# Patient Record
Sex: Male | Born: 1965 | Race: White | Hispanic: No | Marital: Married | Smoking: Never smoker
Health system: Southern US, Community
[De-identification: ages and names within clinical notes are randomized; demographics above are authoritative.]

## PROBLEM LIST (undated history)

## (undated) DIAGNOSIS — K219 Gastro-esophageal reflux disease without esophagitis: Secondary | ICD-10-CM

## (undated) DIAGNOSIS — I10 Essential (primary) hypertension: Secondary | ICD-10-CM

## (undated) HISTORY — PX: COLONOSCOPY: SHX174

## (undated) HISTORY — DX: Gastro-esophageal reflux disease without esophagitis: K21.9

## (undated) HISTORY — PX: PILONIDAL CYST EXCISION: SHX744

---

## 1994-12-25 HISTORY — PX: ANTERIOR CRUCIATE LIGAMENT REPAIR: SHX115

## 2009-07-06 ENCOUNTER — Encounter (INDEPENDENT_AMBULATORY_CARE_PROVIDER_SITE_OTHER): Payer: Self-pay | Admitting: *Deleted

## 2011-10-23 ENCOUNTER — Other Ambulatory Visit: Payer: Self-pay | Admitting: Family Medicine

## 2011-10-23 DIAGNOSIS — R1032 Left lower quadrant pain: Secondary | ICD-10-CM

## 2011-10-25 ENCOUNTER — Other Ambulatory Visit: Payer: Self-pay

## 2011-11-07 ENCOUNTER — Ambulatory Visit (INDEPENDENT_AMBULATORY_CARE_PROVIDER_SITE_OTHER): Payer: Managed Care, Other (non HMO) | Admitting: General Surgery

## 2011-11-07 ENCOUNTER — Encounter (INDEPENDENT_AMBULATORY_CARE_PROVIDER_SITE_OTHER): Payer: Self-pay | Admitting: General Surgery

## 2011-11-07 VITALS — BP 136/98 | HR 84 | Temp 97.3°F | Resp 16 | Ht 74.0 in | Wt 241.0 lb

## 2011-11-07 DIAGNOSIS — L0501 Pilonidal cyst with abscess: Secondary | ICD-10-CM | POA: Insufficient documentation

## 2011-11-07 MED ORDER — DOXYCYCLINE HYCLATE 50 MG PO CAPS: 100.0000 mg | ORAL_CAPSULE | Freq: Two times a day (BID) | ORAL | Status: AC

## 2011-11-07 MED ORDER — OXYCODONE-ACETAMINOPHEN 5-325 MG PO TABS: 1.0000 | ORAL_TABLET | ORAL | Status: AC | PRN

## 2011-11-07 NOTE — Patient Instructions (Signed)
  Pilonidal Cyst Care After A pilonidal cyst occurs when hairs get trapped (ingrown) beneath the skin in the crease between the buttocks over your sacrum (the bone under that crease). Pilonidal cysts are most common in young men with a lot of body hair. When the cyst breaks(ruptured) or leaks, fluid from the cyst may cause burning and itching. If the cyst becomes infected, it causes a painful swelling filled with pus (abscess). The pus and trapped hairs need to be removed (often by lancing) so that the infection can heal. The word pilonidal means hair nest. HOME CARE INSTRUCTIONS If the pilonidal sinus was NOT DRAINING OR LANCED:  Keep the area clean and dry. Bathe or shower daily. Wash the area well with a germ-killing soap. Hot tub baths may help prevent infection. Dry the area well with a towel.   Avoid tight clothing in order to keep area as moisture-free as possible.   Keep area between buttocks as free from hair as possible. A depilatory may be used.   Take antibiotics as directed.   Only take over-the-counter or prescription medicines for pain, discomfort, or fever as directed by your caregiver.  If the cyst WAS INFECTED AND NEEDED TO BE DRAINED:  Your caregiver may have packed the wound with gauze to keep the wound open. This allows the wound to heal from the inside outward and continue to drain.   Return as directed for a wound check.   In the morning, in the shower, remove the bandage and remove the packing strip. You may cover the area with a dry gauze or wear a pad in your underwear. Sponge baths are a good alternative. Sitz baths may be used three to four times a day or as directed.   If an antibiotic was ordered to fight the infection, take as directed.   Only take over-the-counter or prescription medicines for pain, discomfort, or fever as directed by your caregiver.   If a drain was in place and removed, use sitz baths for 20 minutes 4 times per day. Clean the wound gently  with mild unscented soap, pat dry, and then apply a dry dressing as directed.  If you had surgery and IT WAS MARSUPIALIZED (LEFT OPEN):  Your wound was packed with gauze to keep the wound open. This allows the wound to heal from the inside outwards and continue draining. The changing of the dressing regularly also helps keep the wound clean.   Return as directed for a wound check.   If you take tub baths or showers, repack the wound with gauze as directed following. Sponge baths are a good alternative. Sitz baths can also be used. This may be done three to four times a day or as directed.   If an antibiotic was ordered to fight the infection, take as directed.   Only take over-the-counter or prescription medicines for pain, discomfort, or fever as directed by your caregiver.   If you had surgery and the wound was closed you may care for it as directed. This generally includes keeping it dry and clean and dressing it as directed.  SEEK MEDICAL CARE IF:   You have increased pain, swelling, redness, drainage, or bleeding from the area.   You have a fever.   You have muscles aches, dizziness, or a general ill feeling.  Document Released: 01/11/2007 Document Revised: 08/23/2011 Document Reviewed: 03/28/2007 [facility] Patient Information 2012 [place], [place].

## 2011-11-07 NOTE — Progress Notes (Signed)
Chief complaint: Referral from dermatologist; pilonidal cyst  History of present illness: 45 year old Caucasian male referred by his dermatologist for evaluation of an infected pilonidal cyst.  The patient states that he's had a bump in that area for over a year. He finally went to see a physician about it today.  At the doctor's office, the area was red and swollen and indurated. The papule was shaved off with some drainage of brown purulent material. He was sent here for evaluation. 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 He denies any fevers or chills, weight change, night sweats, or previous incision and drainages in that area.  Past Medical History  Diagnosis Date  . GERD (gastroesophageal reflux disease)   . Pilonidal cyst    Past Surgical History  Procedure Date  . Anterior cruciate ligament repair 1996    left   No Known Allergies  Current Outpatient Prescriptions  Medication Sig Dispense Refill  . fish oil-omega-3 fatty acids 1000 MG capsule Take 2 g by mouth daily.        Marland Kitchen omeprazole (PRILOSEC) 20 MG capsule Take 20 mg by mouth daily.        Marland Kitchen doxycycline (VIBRAMYCIN) 50 MG capsule Take 2 capsules (100 mg total) by mouth 2 (two) times daily.  28 capsule  0  . oxyCODONE-acetaminophen (ROXICET) 5-325 MG per tablet Take 1-2 tablets by mouth every 4 (four) hours as needed for pain.  30 tablet  0   Family History  Problem Relation Age of Onset  . Cancer Paternal Uncle     colon  . Cancer Paternal Grandmother     colon  . Cancer Paternal Grandfather     colon   History  Substance Use Topics  . Smoking status: Never Smoker   . Smokeless tobacco: Not on file  . Alcohol Use: No   Review of systems: A 10 point review of systems was performed. All systems are negative except was met in the history of present illness  Physical exam: BP 136/98  Pulse 84  Temp(Src) 97.3 F (36.3 C) (Temporal)  Resp 16  Ht 6\' 2"  (1.88 m)  Wt 241 lb (109.317 kg)  BMI  30.94 kg/m2 Gen.-well-developed, well-nourished obese Caucasian male in no apparent distress Pulmonary-lungs are clear, symmetric chest rises Cardiac-regular rate and rhythm Abdomen-soft, nontender, nondistended Psych-alert, oriented, appropriate HEENT-atraumatic, normocephalic, pupils equal, no icterus, neck supple Rectal-no external hemorrhoids or fissures Skin-in the gluteal cleft, there is a pilonidal sinus with surrounding hair. 1 cm superior and to the left of the gluteal fold there is an area of induration and tenderness about 2 cm x 2 cm. The overlying skin as been shaved.  Data reviewed: Reviewed the handwritten note from the patient's dermatologist  Assessment and plan: Pilonidal sinus with abscess  I do not believe this has been adequately drained. I recommended incising the area to promote further drainage. We discussed the risks and benefits including but not limited to bleeding, worsening infection, need for additional procedures. We discussed the aftercare.  After obtaining verbal consent, the area was prepped with Betadine. 2 cc of 1% Xylocaine mixed with epinephrine and sodium bicarbonate was injected over the area of maximal induration. I then made a cruciate incision with #15 blade. The cavity was opened up. The patient tolerated the procedure well. The wound was packed with quarter inch iodoform gauze followed by a dressing.  We had discussed pilonidal disease. The patient was given Agricultural engineer. The patient was  given wound care instructions. He is given a prescription for doxycycline and Percocet. I'll see him in the office in 2 weeks.

## 2011-11-23 ENCOUNTER — Encounter (INDEPENDENT_AMBULATORY_CARE_PROVIDER_SITE_OTHER): Payer: Managed Care, Other (non HMO) | Admitting: General Surgery

## 2011-11-24 ENCOUNTER — Encounter (INDEPENDENT_AMBULATORY_CARE_PROVIDER_SITE_OTHER): Payer: Managed Care, Other (non HMO) | Admitting: General Surgery

## 2016-06-06 ENCOUNTER — Encounter: Payer: Self-pay | Admitting: Gastroenterology

## 2016-07-18 ENCOUNTER — Ambulatory Visit (AMBULATORY_SURGERY_CENTER): Payer: Self-pay

## 2016-07-18 ENCOUNTER — Encounter (INDEPENDENT_AMBULATORY_CARE_PROVIDER_SITE_OTHER): Payer: Self-pay

## 2016-07-18 VITALS — Ht 75.0 in | Wt 240.4 lb

## 2016-07-18 DIAGNOSIS — Z800 Family history of malignant neoplasm of digestive organs: Secondary | ICD-10-CM

## 2016-07-18 MED ORDER — NA SULFATE-K SULFATE-MG SULF 17.5-3.13-1.6 GM/177ML PO SOLN: ORAL | 0 refills | Status: DC

## 2016-07-18 NOTE — Progress Notes (Signed)
Patient ID: Ryan Maddox, male   DOB: 1966-09-08, 50 y.o.   MRN: [id number] Per pt, no allergies to soy or egg products.Pt not taking any weight loss meds or using  O2 at home.

## 2016-07-19 ENCOUNTER — Encounter: Payer: Self-pay | Admitting: Gastroenterology

## 2016-08-04 ENCOUNTER — Ambulatory Visit (AMBULATORY_SURGERY_CENTER): Payer: Managed Care, Other (non HMO) | Admitting: Gastroenterology

## 2016-08-04 ENCOUNTER — Encounter: Payer: Self-pay | Admitting: Gastroenterology

## 2016-08-04 VITALS — BP 131/96 | HR 72 | Temp 98.0°F | Resp 16 | Ht 75.0 in | Wt 240.0 lb

## 2016-08-04 DIAGNOSIS — Z1211 Encounter for screening for malignant neoplasm of colon: Secondary | ICD-10-CM

## 2016-08-04 DIAGNOSIS — Z800 Family history of malignant neoplasm of digestive organs: Secondary | ICD-10-CM

## 2016-08-04 MED ORDER — SODIUM CHLORIDE 0.9 % IV SOLN: 500.0000 mL | INTRAVENOUS | Status: AC

## 2016-08-04 NOTE — Progress Notes (Signed)
Report to PACU, RN, vss, BBS= Clear.  

## 2016-08-04 NOTE — Op Note (Signed)
[facility] Patient Name: Ryan Maddox Procedure Date: 08/04/2016 10:49 AM MRN: [id number] Endoscopist: Sherilyn Cooter L. Myrtie Neither , MD Age: 50 Referring MD:  Date of Birth: Mar 07, 1966 Gender: Male Account #: [id number] Procedure:                Colonoscopy Indications:              Screening for colorectal malignant neoplasm, Family                            history of colon cancer in multiple second-degree                            relatives (patient's last colonoscopy in 2005) Medicines:                Monitored Anesthesia Care Procedure:                Pre-Anesthesia Assessment:                           - Prior to the procedure, a History and Physical                            was performed, and patient medications and                            allergies were reviewed. The patient's tolerance of                            previous anesthesia was also reviewed. The risks                            and benefits of the procedure and the sedation                            options and risks were discussed with the patient.                            All questions were answered, and informed consent                            was obtained. Prior Anticoagulants: The patient has                            taken no previous anticoagulant or antiplatelet                            agents. ASA Grade Assessment: II - A patient with                            mild systemic disease. After reviewing the risks                            and benefits, the patient was deemed in  satisfactory condition to undergo the procedure.                           After obtaining informed consent, the colonoscope                            was passed under direct vision. Throughout the                            procedure, the patient's blood pressure, pulse, and                            oxygen saturations were monitored continuously. The                            Model  CF-[id number]) scope was introduced                            through the anus and advanced to the the cecum,                            identified by appendiceal orifice and ileocecal                            valve. The colonoscopy was performed without                            difficulty. The patient tolerated the procedure                            well. The quality of the bowel preparation was                            excellent. The ileocecal valve, appendiceal                            orifice, and rectum were photographed. The quality                            of the bowel preparation was evaluated using the                            BBPS [facility] Bowel Preparation Scale) with scores                            of: Right Colon = 3, Transverse Colon = 3 and Left                            Colon = 3 (entire mucosa seen well with no residual                            staining, small fragments of stool or opaque  liquid). The total BBPS score equals 9. The bowel                            preparation used was SUPREP. Scope In: 11:02:56 AM Scope Out: 11:13:16 AM Scope Withdrawal Time: 0 hours 7 minutes 9 seconds  Total Procedure Duration: 0 hours 10 minutes 20 seconds  Findings:                 The perianal and digital rectal examinations were                            normal.                           Multiple diverticula were found in the left colon.                           The exam was otherwise without abnormality on                            direct and retroflexion views. Complications:            No immediate complications. Estimated Blood Loss:     Estimated blood loss: none. Impression:               - Diverticulosis in the left colon.                           - The examination was otherwise normal on direct                            and retroflexion views.                           - No specimens collected. Recommendation:            - Patient has a contact number available for                            emergencies. The signs and symptoms of potential                            delayed complications were discussed with the                            patient. Return to normal activities tomorrow.                            Written discharge instructions were provided to the                            patient.                           - Resume previous diet.                           - Continue present  medications.                           - Repeat colonoscopy in 10 years for screening                            purposes. Henry L. Myrtie Neitheranis, MD 08/04/2016 11:20:28 AM This report has been signed electronically.

## 2016-08-04 NOTE — Patient Instructions (Signed)
Diverticulosis seen today. Handout given on diverticulosis. Resume current medications. Repeat colonoscopy in 10 years. Call us with any questions or concerns. Thank you!!   YOU HAD AN ENDOSCOPIC PROCEDURE TODAY AT THE [facility]:   Refer to the procedure report that was given to you for any specific questions about what was found during the examination.  If the procedure report does not answer your questions, please call your gastroenterologist to clarify.  If you requested that your care partner not be given the details of your procedure findings, then the procedure report has been included in a sealed envelope for you to review at your convenience later.  YOU SHOULD EXPECT: Some feelings of bloating in the abdomen. Passage of more gas than usual.  Walking can help get rid of the air that was put into your GI tract during the procedure and reduce the bloating. If you had a lower endoscopy (such as a colonoscopy or flexible sigmoidoscopy) you may notice spotting of blood in your stool or on the toilet paper. If you underwent a bowel prep for your procedure, you may not have a normal bowel movement for a few days.  Please Note:  You might notice some irritation and congestion in your nose or some drainage.  This is from the oxygen used during your procedure.  There is no need for concern and it should clear up in a day or so.  SYMPTOMS TO REPORT IMMEDIATELY:   Following lower endoscopy (colonoscopy or flexible sigmoidoscopy):  Excessive amounts of blood in the stool  Significant tenderness or worsening of abdominal pains  Swelling of the abdomen that is new, acute  Fever of 100F or higher   For urgent or emergent issues, a gastroenterologist can be reached at any hour by calling [phone].   DIET: Your first meal following the procedure should be a small meal and then it is ok to progress to your normal diet. Heavy or fried foods are harder to digest and may make you feel  nauseous or bloated.  Likewise, meals heavy in dairy and vegetables can increase bloating.  Drink plenty of fluids but you should avoid alcoholic beverages for 24 hours.  ACTIVITY:  You should plan to take it easy for the rest of today and you should NOT DRIVE or use heavy machinery until tomorrow (because of the sedation medicines used during the test).    FOLLOW UP: Our staff will call the number listed on your records the next business day following your procedure to check on you and address any questions or concerns that you may have regarding the information given to you following your procedure. If we do not reach you, we will leave a message.  However, if you are feeling well and you are not experiencing any problems, there is no need to return our call.  We will assume that you have returned to your regular daily activities without incident.  If any biopsies were taken you will be contacted by phone or by letter within the next 1-3 weeks.  Please call us at [phone] if you have not heard about the biopsies in 3 weeks.    SIGNATURES/CONFIDENTIALITY: You and/or your care partner have signed paperwork which will be entered into your electronic medical record.  These signatures attest to the fact that that the information above on your After Visit Summary has been reviewed and is understood.  Full responsibility of the confidentiality of this discharge information lies with you and/or your  care-partner.

## 2016-08-07 ENCOUNTER — Telehealth: Payer: Self-pay | Admitting: *Deleted

## 2016-08-07 NOTE — Telephone Encounter (Signed)
  Follow up Call-  Call back number 08/04/2016  Post procedure Call Back phone  # [phone]  Permission to leave phone message Yes  Some recent data might be hidden     No answer, left message.

## 2017-07-25 ENCOUNTER — Encounter: Payer: Self-pay | Admitting: Neurology

## 2017-07-25 ENCOUNTER — Ambulatory Visit (INDEPENDENT_AMBULATORY_CARE_PROVIDER_SITE_OTHER): Payer: Managed Care, Other (non HMO) | Admitting: Neurology

## 2017-07-25 VITALS — BP 141/99 | HR 91 | Ht 75.0 in | Wt 246.5 lb

## 2017-07-25 DIAGNOSIS — R55 Syncope and collapse: Secondary | ICD-10-CM

## 2017-07-25 NOTE — Patient Instructions (Signed)
   We will check MRI of the brain and get a carotid doppler study and a 2D echo on the heart.

## 2017-07-25 NOTE — Progress Notes (Signed)
Reason for visit: Near syncope  Referring physician: Dr. Ginette PitmanEhinger  Ryan Maddox is a 51 y.o. male  History of present illness:  Ryan Maddox is a 51 year old right-handed white male with history of onset of episodes of dizziness that began about 4 weeks ago. The episodes have become more significant over the last 10 days. The patient is having events where he will have a lightheaded sensation and a sensation of near-syncope that usually occurs when he goes from a sitting to a standing position. The episodes generally do not occur while sitting or while lying down. The patient indicates that the episodes may last about 30 seconds, they are unassociated with loss of vision, but the patient does have a pressure sensation around the left eye during the events and some blurring of vision. He has some cognitive clouding with the episodes. He denies any focal numbness or weakness of the face, arms, or legs. He denies any slurred speech, double vision, or change in hearing. The patient may have occasional tingling in the hands, he has had some episodes of headaches that occurred over the last year and may occur several times a week but do not correlate with the episodes of dizziness. He reports no shortness of breath, chest pain, or palpitations of the heart. He has not had any actual falls with the dizziness. He has had blood work that included a CBC and a comprehensive metabolic profile that did not show any significant abnormalities, an EKG was done was unremarkable. The patient is sent to this office for an evaluation. He has not been able to work because of the dizziness. He works as a Naval architecttruck driver.  Past Medical History:  Diagnosis Date  . GERD (gastroesophageal reflux disease)   . Pilonidal cyst     Past Surgical History:  Procedure Laterality Date  . ANTERIOR CRUCIATE LIGAMENT REPAIR  1996   left  . COLONOSCOPY    . PILONIDAL CYST EXCISION      Family History  Problem Relation Age of  Onset  . Cancer Paternal Uncle        colon  . Cancer Paternal Grandmother        colon  . Cancer Paternal Grandfather        colon    Social history:  reports that he has never smoked. He has quit using smokeless tobacco. He reports that he does not drink alcohol or use drugs.  Medications:  Prior to Admission medications   Medication Sig Start Date End Date Taking? Authorizing Provider  fish oil-omega-3 fatty acids 1000 MG capsule Take 2 g by mouth daily.     Yes [provider]  omeprazole (PRILOSEC) 20 MG capsule Take 20 mg by mouth daily.     Yes [provider]     No Known Allergies  ROS:  Out of a complete 14 system review of symptoms, the patient complains only of the following symptoms, and all other reviewed systems are negative.  Fatigue Ringing in the ears Blurred vision, eye pain Joint pain, achy muscles Headache, dizziness Depression, anxiety, decreased energy  Blood pressure (!) 141/99, pulse 91, height 6\' 3"  (1.905 m), weight 246 lb 8 oz (111.8 kg).   Blood pressure, right arm, sitting is 132/90. Blood pressure, standing is 110 systolic.  Physical Exam  General: The patient is alert and cooperative at the time of the examination. The patient is moderately obese.  Eyes: Pupils are equal, round, and reactive to light. Discs  are flat bilaterally.  Neck: The neck is supple, no carotid bruits are noted.  Respiratory: The respiratory examination is clear.  Cardiovascular: The cardiovascular examination reveals a regular rate and rhythm, no obvious murmurs or rubs are noted.  Skin: Extremities are without significant edema.  Neurologic Exam  Mental status: The patient is alert and oriented x 3 at the time of the examination. The patient has apparent normal recent and remote memory, with an apparently normal attention span and concentration ability.  Cranial nerves: Facial symmetry is present. There is good sensation of the face to  pinprick and soft touch bilaterally. The strength of the facial muscles and the muscles to head turning and shoulder shrug are normal bilaterally. Speech is well enunciated, no aphasia or dysarthria is noted. Extraocular movements are full. Visual fields are full. The tongue is midline, and the patient has symmetric elevation of the soft palate. No obvious hearing deficits are noted.  Motor: The motor testing reveals 5 over 5 strength of all 4 extremities. Good symmetric motor tone is noted throughout.  Sensory: Sensory testing is intact to pinprick, soft touch, vibration sensation, and position sense on all 4 extremities. No evidence of extinction is noted.  Coordination: Cerebellar testing reveals good finger-nose-finger and heel-to-shin bilaterally.  Gait and station: Gait is normal. Tandem gait is normal. Romberg is negative. No drift is seen.  Reflexes: Deep tendon reflexes are symmetric and normal bilaterally. Toes are downgoing bilaterally.   Assessment/Plan:  1. Episodic dizziness, near syncope  The patient has begun having episodes of feeling faint with standing. The patient appears to be mildly orthostatic today. He is to ensure that he is well-hydrated, we will undergo a workup to include MRI of the brain, 2-D echocardiogram, and a carotid Doppler study. The patient is not to return to work until the workup above is completed. He will otherwise follow-up in 2 months. Heart rate today is running in the 90s, a thyroid study may need to be done if this has not been done previously.  Marlan Palau. Keith Willis MD 07/25/2017 2:20 PM  [facility] [address] [place], [place] [id number]  Phone [phone] Fax [phone]

## 2017-07-31 ENCOUNTER — Telehealth: Payer: Self-pay | Admitting: Neurology

## 2017-07-31 ENCOUNTER — Ambulatory Visit (HOSPITAL_COMMUNITY): Payer: Managed Care, Other (non HMO) | Attending: Cardiology

## 2017-07-31 ENCOUNTER — Telehealth: Payer: Self-pay | Admitting: *Deleted

## 2017-07-31 DIAGNOSIS — R55 Syncope and collapse: Secondary | ICD-10-CM

## 2017-07-31 DIAGNOSIS — I5030 Unspecified diastolic (congestive) heart failure: Secondary | ICD-10-CM | POA: Insufficient documentation

## 2017-07-31 DIAGNOSIS — I051 Rheumatic mitral insufficiency: Secondary | ICD-10-CM | POA: Insufficient documentation

## 2017-07-31 DIAGNOSIS — I420 Dilated cardiomyopathy: Secondary | ICD-10-CM | POA: Insufficient documentation

## 2017-07-31 NOTE — Telephone Encounter (Signed)
LVM for pt to call back and provide fax information to send letter that Dr Anne HahnWillis wrote for him. Gave [facility] phone number for him to call.

## 2017-07-31 NOTE — Telephone Encounter (Signed)
Patient called office returning RN's call stating he does not have a fax number to provide to have letter faxed.  Patient states they will come pick up the letter.

## 2017-07-31 NOTE — Telephone Encounter (Signed)
Noted, placed letter up front for patient pick up.

## 2017-07-31 NOTE — Telephone Encounter (Signed)
I called patient. Echocardiogram the heart was unremarkable.   2D echo 07/31/17:   Study Conclusions  - Left ventricle: The cavity size was normal. There was moderate focal basal and mild concentric hypertrophy. Systolic function was normal. Wall motion was normal; there were no regional wall motion abnormalities. There was an increased relative contribution of atrial contraction to ventricular filling. Doppler parameters are consistent with abnormal left ventricular relaxation (grade 1 diastolic dysfunction). - Aorta: Aortic root dimension: 41 mm (ED). Ascending aortic diameter: 42 mm (S). - Aortic root: The aortic root was mildly dilated. - Ascending aorta: The ascending aorta was mildly dilated. - Mitral valve: There was trivial regurgitation. - Pulmonary arteries: Systolic pressure could not be accurately estimated.

## 2017-08-01 ENCOUNTER — Telehealth: Payer: Self-pay | Admitting: *Deleted

## 2017-08-01 NOTE — Telephone Encounter (Signed)
Gave completed/signed disability claim form from [employer] back to medical records to process for patient.

## 2017-08-02 NOTE — Telephone Encounter (Signed)
LMOM for patient to notify disability claim form from [employer] has been completed and if he can please contact our office to pay the $50 fee.

## 2017-08-03 ENCOUNTER — Other Ambulatory Visit: Payer: Self-pay | Admitting: *Deleted

## 2017-08-03 DIAGNOSIS — R55 Syncope and collapse: Secondary | ICD-10-CM

## 2017-08-05 ENCOUNTER — Ambulatory Visit
Admission: RE | Admit: 2017-08-05 | Discharge: 2017-08-05 | Disposition: A | Discharge status: Home / Self Care | Payer: Managed Care, Other (non HMO) | Source: Ambulatory Visit | Attending: Neurology | Admitting: Neurology

## 2017-08-05 DIAGNOSIS — R55 Syncope and collapse: Secondary | ICD-10-CM

## 2017-08-06 ENCOUNTER — Telehealth: Payer: Self-pay | Admitting: Neurology

## 2017-08-06 NOTE — Telephone Encounter (Signed)
I called the patient. MRI of the brain is normal. The carotid Doppler study report is pending.   MRI of the brain 08/05/17:  IMPRESSION:  This is a normal MRI of the brain without contrast

## 2017-08-07 ENCOUNTER — Telehealth: Payer: Self-pay | Admitting: Neurology

## 2017-08-07 ENCOUNTER — Ambulatory Visit (HOSPITAL_COMMUNITY)
Admission: RE | Admit: 2017-08-07 | Discharge: 2017-08-07 | Disposition: A | Discharge status: Home / Self Care | Payer: Managed Care, Other (non HMO) | Source: Ambulatory Visit | Attending: Neurology | Admitting: Neurology

## 2017-08-07 DIAGNOSIS — I6523 Occlusion and stenosis of bilateral carotid arteries: Secondary | ICD-10-CM | POA: Insufficient documentation

## 2017-08-07 DIAGNOSIS — R55 Syncope and collapse: Secondary | ICD-10-CM

## 2017-08-07 LAB — VAS US CAROTID
LCCADSYS: 85 cm/s
LEFT ECA DIAS: -26 cm/s
LEFT VERTEBRAL DIAS: -20 cm/s
LICAPDIAS: -29 cm/s
Left CCA dist dias: 32 cm/s
Left CCA prox dias: 37 cm/s
Left CCA prox sys: 193 cm/s
Left ICA dist dias: -32 cm/s
Left ICA dist sys: -70 cm/s
Left ICA prox sys: -68 cm/s
RCCADSYS: -89 cm/s
RCCAPDIAS: 20 cm/s
RIGHT ECA DIAS: -34 cm/s
RIGHT VERTEBRAL DIAS: -15 cm/s
Right CCA prox sys: 76 cm/s

## 2017-08-07 NOTE — Progress Notes (Signed)
*  PRELIMINARY RESULTS* Vascular Ultrasound Carotid Duplex (Doppler) has been completed.  Preliminary findings: 40-59% throughout the right internal carotid artery.  1-39% left internal carotid artery stenosis. Bilateral vertebral arteries appear patent and antegrade.  Ryan FischerCharlotte C Romonia Maddox 08/07/2017, 1:31 PM

## 2017-08-07 NOTE — Telephone Encounter (Signed)
I called the patient. The carotid Doppler study is notable for some stenosis of the right internal carotid artery, this is not flow limiting. The left carotid artery is unremarkable. Vertebral artery flow is antegrade. The MRI the brain was normal, 2-D echocardiogram was unremarkable, the patient did have some slight orthostasis on clinical examination, he is to stay well hydrated.  If the patient is doing well with his symptoms he may return to work at any time. If he needs a note regarding this, he is to contact me.  Carotid doppler 08/07/17:  Summary:  - The vertebral arteries appear patent with antegrade flow. 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 - Findings consistent with a 1- 39 percent stenosis involving the left internal carotid artery.

## 2017-08-31 ENCOUNTER — Telehealth: Payer: Self-pay | Admitting: *Deleted

## 2017-08-31 NOTE — Telephone Encounter (Signed)
Gave completed/signed updated disability medical request form from [employer] back to medical records to process for patient.

## 2017-09-03 NOTE — Telephone Encounter (Signed)
LM on VM to advise patient disability paperwork for [employer] has been completed and we need to collect a $50 fee for processing.  Advised patient to call back our office.

## 2017-10-10 ENCOUNTER — Ambulatory Visit: Payer: Managed Care, Other (non HMO) | Admitting: Adult Health

## 2019-03-03 DIAGNOSIS — R110 Nausea: Secondary | ICD-10-CM | POA: Diagnosis not present

## 2020-11-26 ENCOUNTER — Other Ambulatory Visit: Payer: Self-pay

## 2020-11-26 ENCOUNTER — Emergency Department (HOSPITAL_COMMUNITY): Payer: No Typology Code available for payment source

## 2020-11-26 ENCOUNTER — Emergency Department (HOSPITAL_COMMUNITY)
Admission: EM | Admit: 2020-11-26 | Discharge: 2020-11-26 | Disposition: A | Discharge status: Home / Self Care | Payer: No Typology Code available for payment source | Attending: Emergency Medicine | Admitting: Emergency Medicine

## 2020-11-26 ENCOUNTER — Encounter (HOSPITAL_COMMUNITY): Payer: Self-pay

## 2020-11-26 DIAGNOSIS — Z7721 Contact with and (suspected) exposure to potentially hazardous body fluids: Secondary | ICD-10-CM | POA: Insufficient documentation

## 2020-11-26 DIAGNOSIS — M79641 Pain in right hand: Secondary | ICD-10-CM

## 2020-11-26 DIAGNOSIS — Z87891 Personal history of nicotine dependence: Secondary | ICD-10-CM | POA: Diagnosis not present

## 2020-11-26 DIAGNOSIS — I10 Essential (primary) hypertension: Secondary | ICD-10-CM | POA: Diagnosis not present

## 2020-11-26 DIAGNOSIS — Y9301 Activity, walking, marching and hiking: Secondary | ICD-10-CM | POA: Insufficient documentation

## 2020-11-26 HISTORY — DX: Essential (primary) hypertension: I10

## 2020-11-26 LAB — ETHANOL: Alcohol, Ethyl (B): <10 mg/dL (ref <10)

## 2020-11-26 NOTE — Discharge Instructions (Signed)
Your x-ray did not show an acute break to your hand.  Take Tylenol and ibuprofen for pain.  Likely saliva is very unlikely to spread disease unless it contained blood.  It is especially unlikely to spread if it hit intact skin.  Please follow-up with your provider in the office.
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
## 2020-11-26 NOTE — Progress Notes (Signed)
Orthopedic Tech Progress Note Patient Details:  EVERARDO VORIS 1966/01/14 [id number]  Ortho Devices Type of Ortho Device: Velcro wrist forearm splint Ortho Device/Splint Location: right Ortho Device/Splint Interventions: Application   Post Interventions Patient Tolerated: Well Instructions Provided: Care of device   Saul Fordyce 11/26/2020, 9:11 AM
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
## 2022-06-18 IMAGING — CR DG HAND COMPLETE 3+V*R*
3 series · 3 of 3 positions shown · non-contrast
Comparison: No prior.

CLINICAL DATA: Right hand injury.

EXAM:
RIGHT HAND - COMPLETE 3+ VIEW

[x hand pa right]
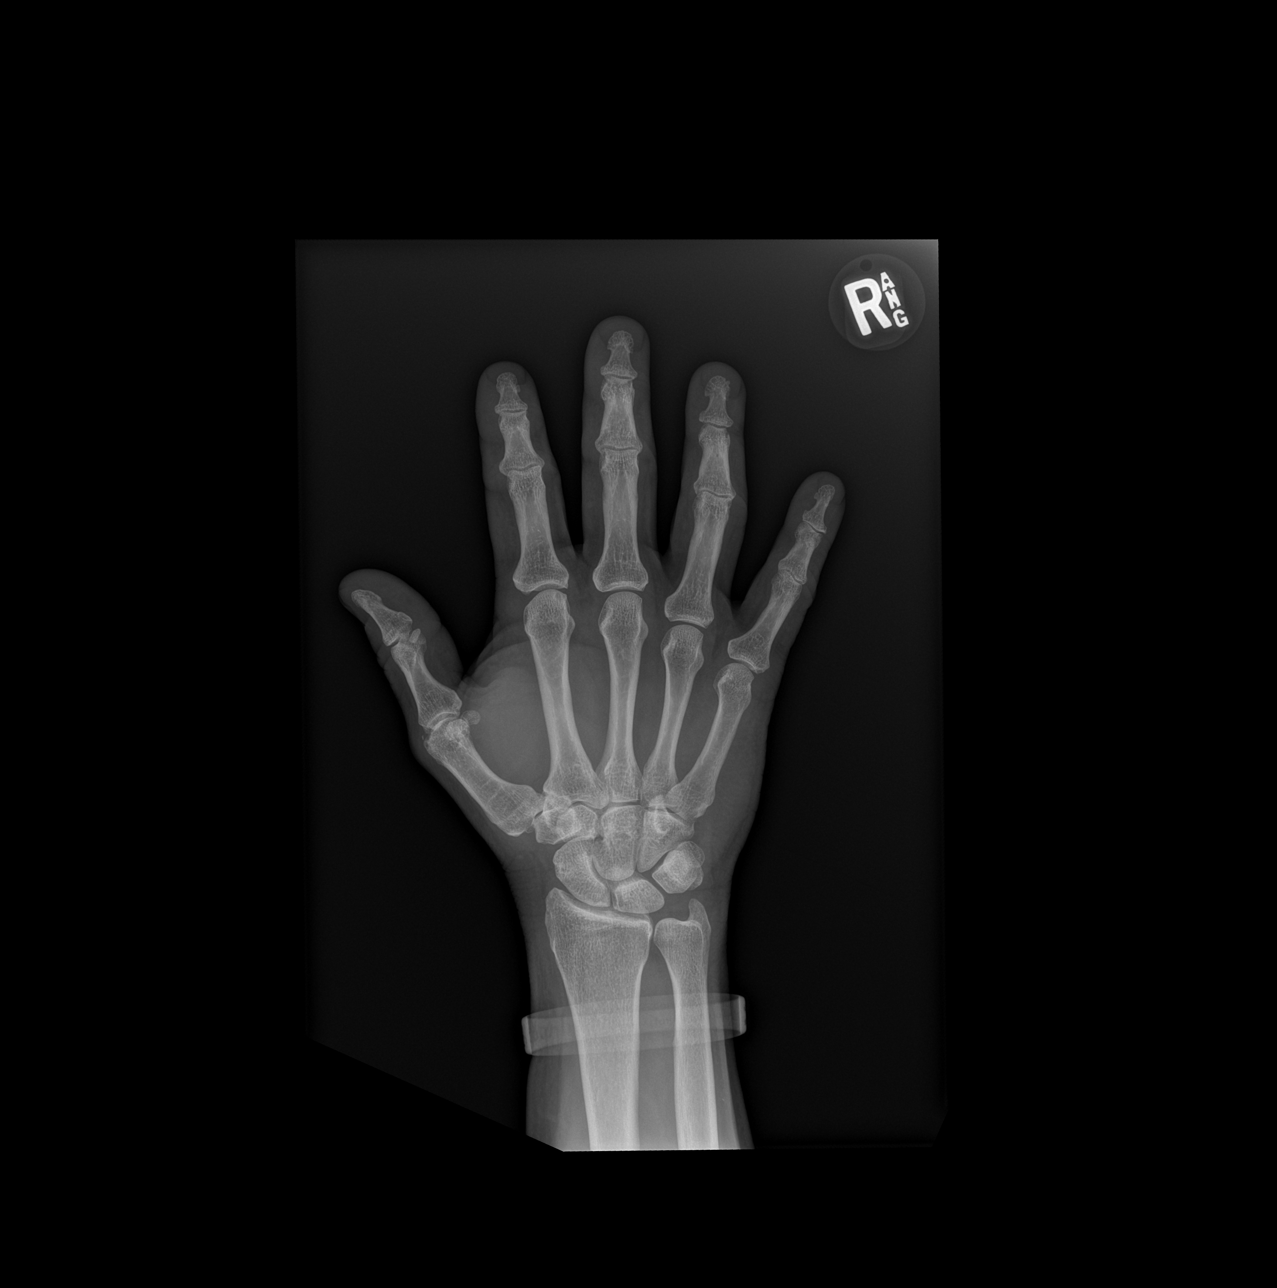

[x hand obl right]
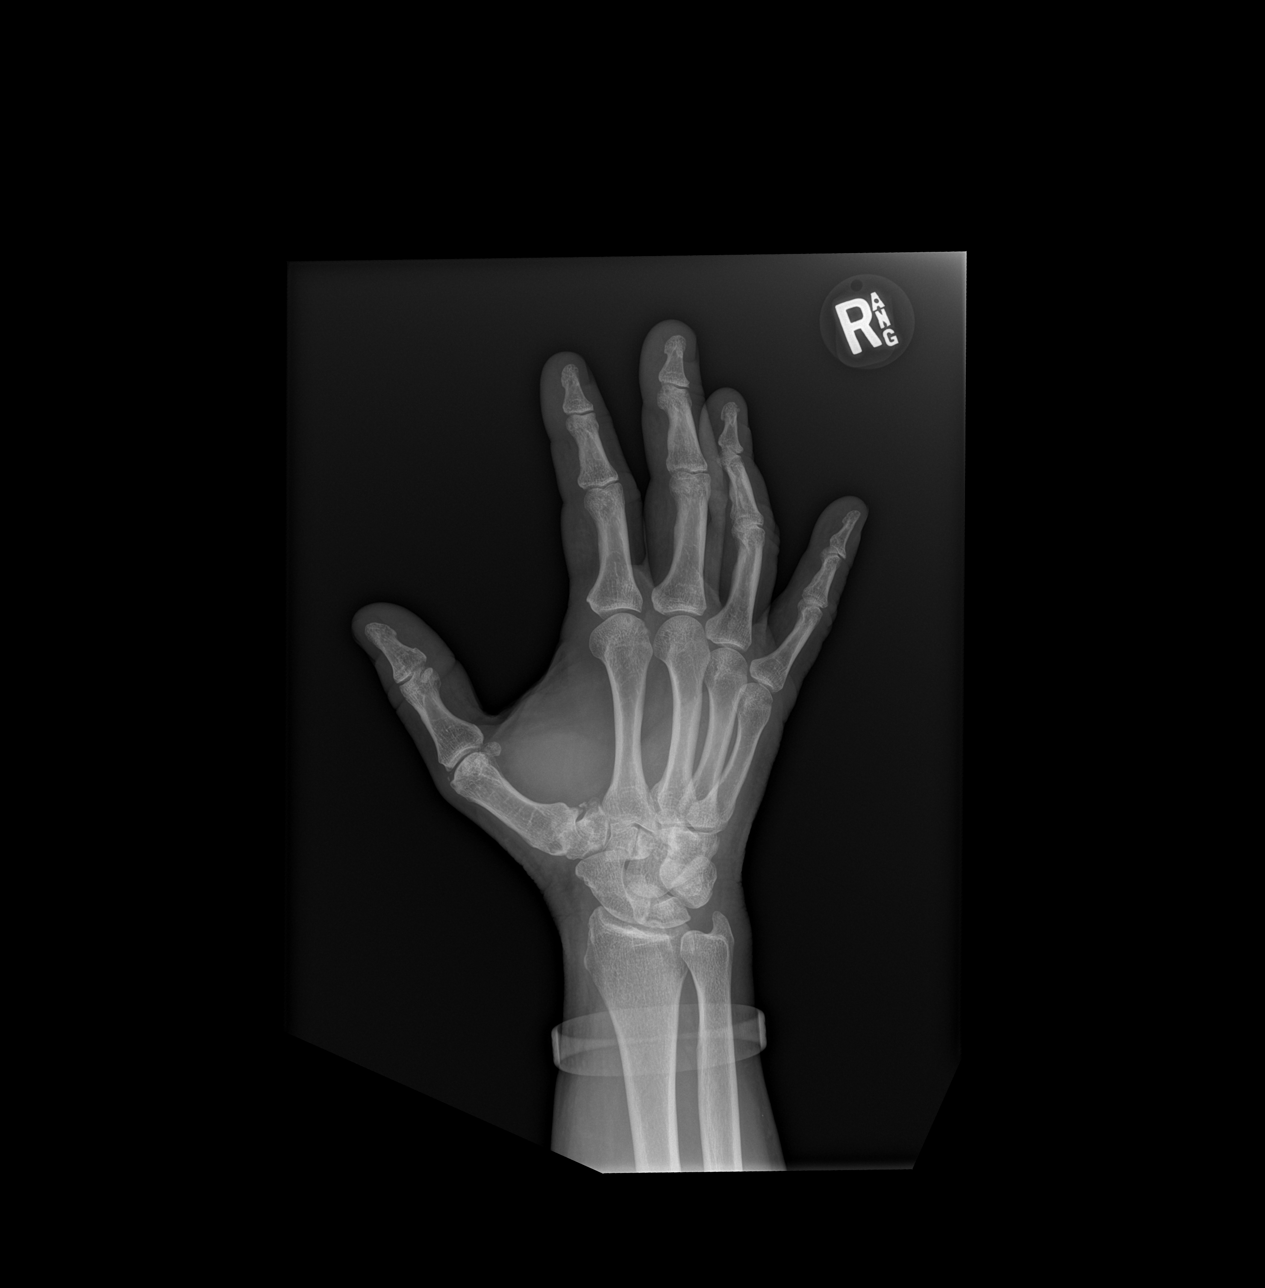

[x hand lat right]
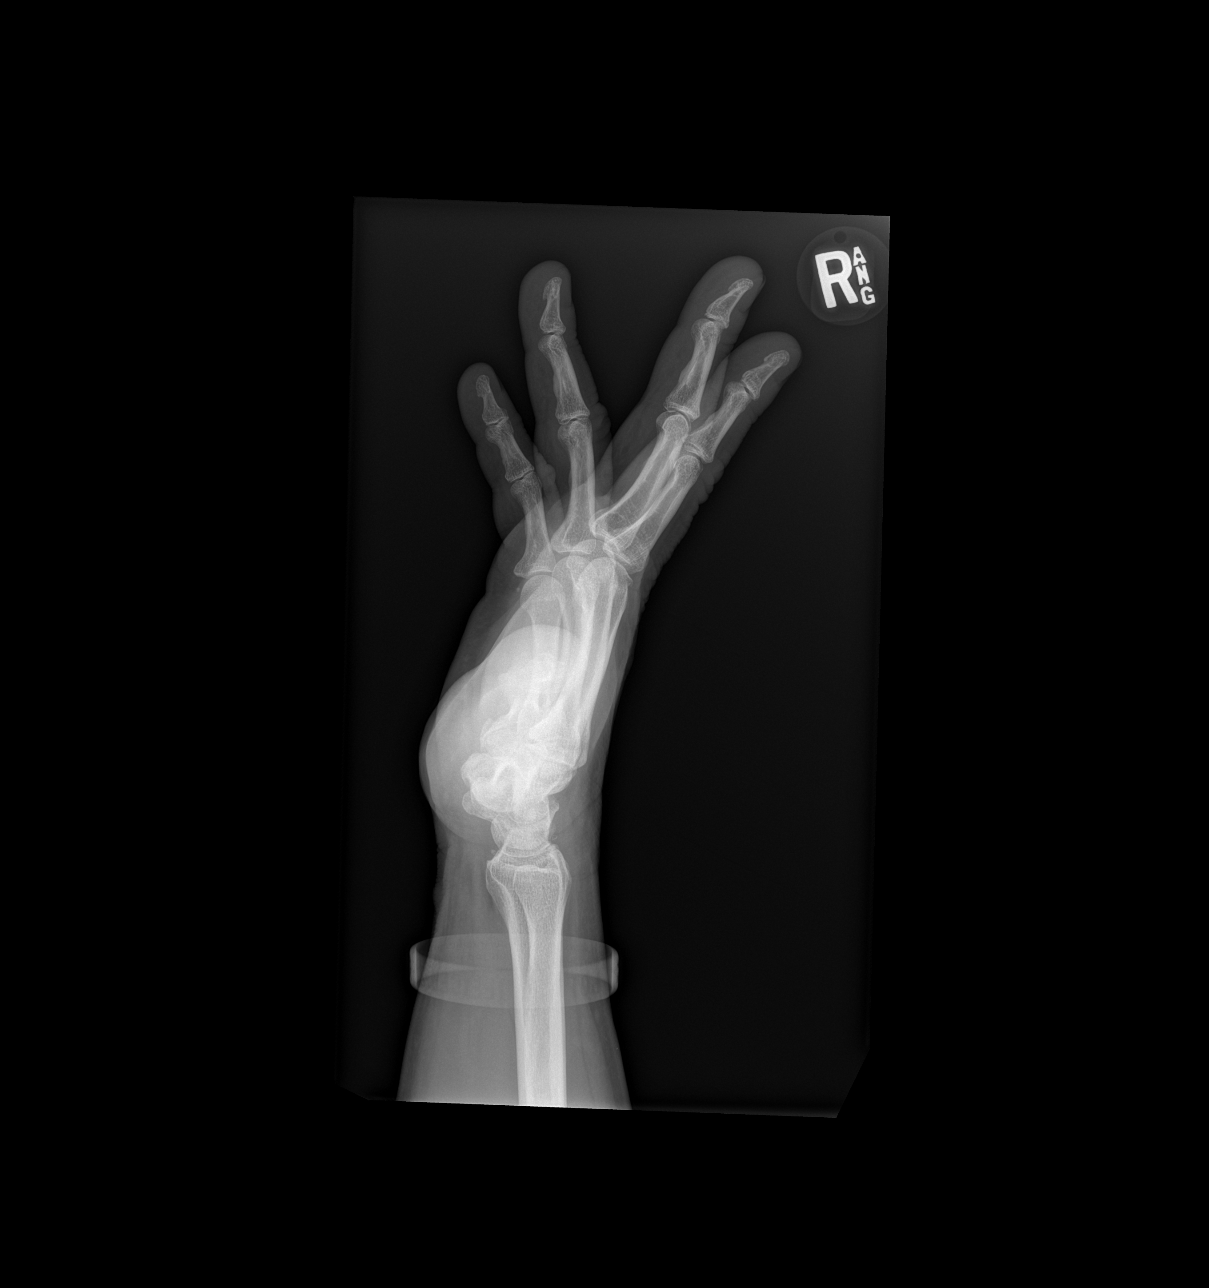

[3 of 3 positions shown; findings below may reference images not displayed]

FINDINGS: Tiny bony density noted adjacent to the distal portion of the right
first metacarpal. This could represent a tiny fracture fragment age
undetermined. Tiny corticated bony densities noted about the
anterior posterior aspect of the wrist. These may represent tiny
fracture fragments, possibly old. No other focal abnormality
identified. No prominent arthropathy noted. No radiopaque foreign
body.
IMPRESSION: 1. Tiny bony density noted adjacent to the distal portion of the
right first metacarpal. This could represent a tiny fracture
fragment, age undetermined.
2. Tiny corticated bony densities noted about the anterior and
posterior aspect of the wrist. These may represent tiny fracture
fragments, possibly old. No other focal abnormality identified.

## 2024-01-28 ENCOUNTER — Other Ambulatory Visit: Payer: Self-pay | Admitting: Occupational Medicine

## 2024-01-28 ENCOUNTER — Ambulatory Visit: Payer: Self-pay

## 2024-01-28 DIAGNOSIS — M25562 Pain in left knee: Secondary | ICD-10-CM
